# Patient Record
Sex: Female | Born: 1990 | Race: Black or African American | Hispanic: No | Marital: Single | Smoking: Current every day smoker
Health system: Southern US, Community
[De-identification: ages and names within clinical notes are randomized; demographics above are authoritative.]

## PROBLEM LIST (undated history)

## (undated) DIAGNOSIS — G43909 Migraine, unspecified, not intractable, without status migrainosus: Secondary | ICD-10-CM

## (undated) DIAGNOSIS — R55 Syncope and collapse: Secondary | ICD-10-CM

## (undated) DIAGNOSIS — G244 Idiopathic orofacial dystonia: Secondary | ICD-10-CM

## (undated) DIAGNOSIS — L930 Discoid lupus erythematosus: Secondary | ICD-10-CM

## (undated) DIAGNOSIS — F32A Depression, unspecified: Secondary | ICD-10-CM

## (undated) DIAGNOSIS — D649 Anemia, unspecified: Secondary | ICD-10-CM

## (undated) DIAGNOSIS — N920 Excessive and frequent menstruation with regular cycle: Secondary | ICD-10-CM

## (undated) DIAGNOSIS — D509 Iron deficiency anemia, unspecified: Secondary | ICD-10-CM

## (undated) DIAGNOSIS — F411 Generalized anxiety disorder: Secondary | ICD-10-CM

## (undated) HISTORY — DX: Idiopathic orofacial dystonia: G24.4

## (undated) HISTORY — DX: Excessive and frequent menstruation with regular cycle: N92.0

## (undated) HISTORY — DX: Generalized anxiety disorder: F41.1

## (undated) HISTORY — DX: Migraine, unspecified, not intractable, without status migrainosus: G43.909

## (undated) HISTORY — PX: TUBAL LIGATION: SHX77

## (undated) HISTORY — DX: Depression, unspecified: F32.A

## (undated) HISTORY — DX: Iron deficiency anemia, unspecified: D50.9

---
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
## 2010-11-19 LAB — COMPREHENSIVE METABOLIC PANEL
AST: 24 U/L (ref 0–37)
Albumin: 3.5 g/dL (ref 3.5–5.2)
Calcium: 9.5 mg/dL (ref 8.4–10.5)
Creatinine, Ser: 0.98 mg/dL (ref 0.4–1.2)
GFR calc Af Amer: >60 mL/min (ref >60)

## 2010-11-19 LAB — URINALYSIS, ROUTINE W REFLEX MICROSCOPIC
Glucose, UA: NEGATIVE mg/dL
Hgb urine dipstick: NEGATIVE
Specific Gravity, Urine: 1.016 (ref 1.005–1.030)
pH: 7 (ref 5.0–8.0)

## 2010-11-19 LAB — URINE MICROSCOPIC-ADD ON

## 2010-11-20 LAB — URINALYSIS, ROUTINE W REFLEX MICROSCOPIC
Hgb urine dipstick: NEGATIVE
Protein, ur: NEGATIVE mg/dL
Urobilinogen, UA: 0.2 mg/dL (ref 0.0–1.0)

## 2010-11-20 LAB — DIFFERENTIAL
Basophils Absolute: 0.2 10*3/uL — ABNORMAL HIGH (ref 0.0–0.1)
Basophils Relative: 4 % — ABNORMAL HIGH (ref 0–1)
Eosinophils Relative: 2 % (ref 0–5)
Monocytes Absolute: 0.2 10*3/uL (ref 0.1–1.0)
Neutro Abs: 2.1 10*3/uL (ref 1.7–7.7)

## 2010-11-20 LAB — CBC
Hemoglobin: 13.4 g/dL (ref 12.0–15.0)
MCHC: 34.7 g/dL (ref 30.0–36.0)
RDW: 14.4 % (ref 11.5–15.5)

## 2010-11-20 LAB — POCT PREGNANCY, URINE: Preg Test, Ur: NEGATIVE

## 2011-01-05 IMAGING — CT CT HEAD W/O CM
1 series · 16 of 30 positions shown, 20 images · non-contrast
Comparison: None

CLINICAL DATA: Headaches and weakness.

CT HEAD WITHOUT CONTRAST
TECHNIQUE: Contiguous axial images were obtained from the base of
the skull through the vertex without contrast.

[Series 2: head_seq 4.5 h37s st · axial · 0.43mm/px · z∈[-107,+19]mm · 16 of 32 slices shown, 20 images]
[im 2/32  brain]
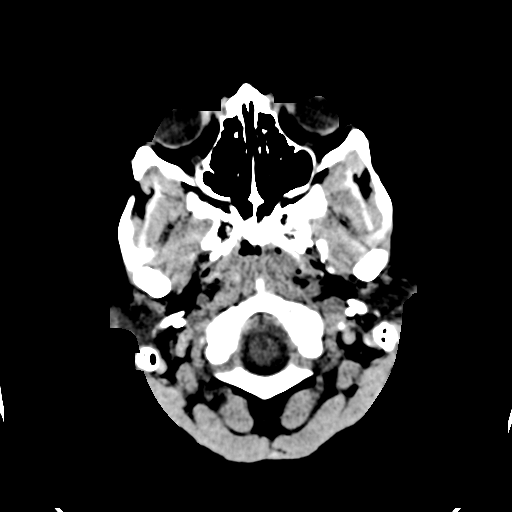
[im 2/32  bone]
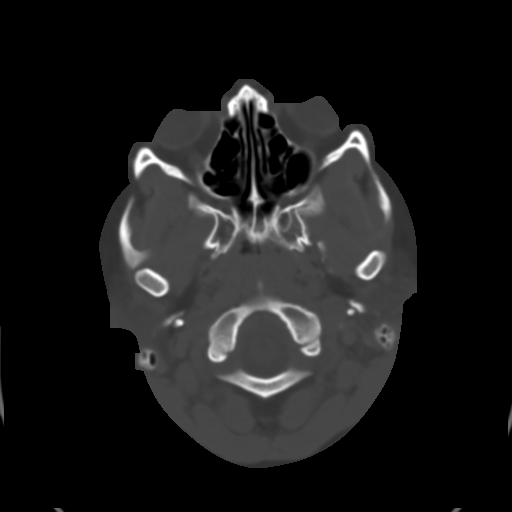
[im 4/32  brain]
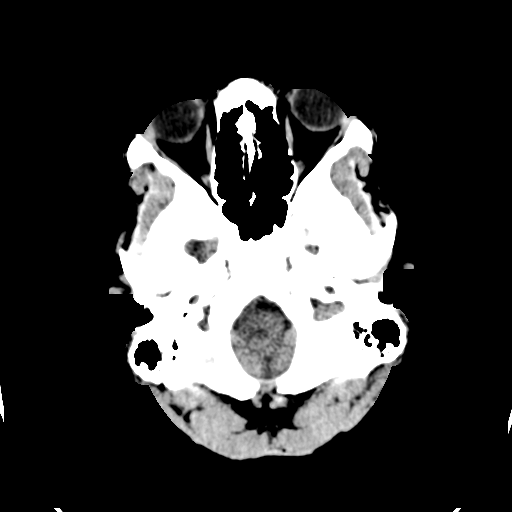
[im 6/32  brain]
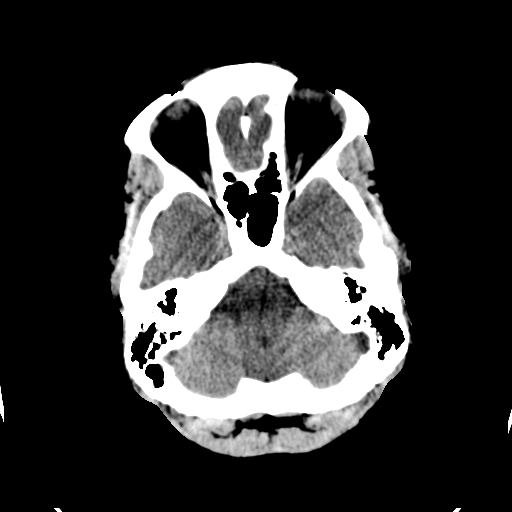
[im 8/32  brain]
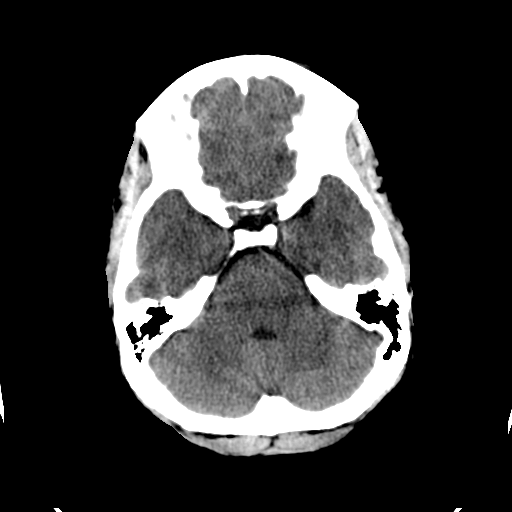
[im 9/32  brain]
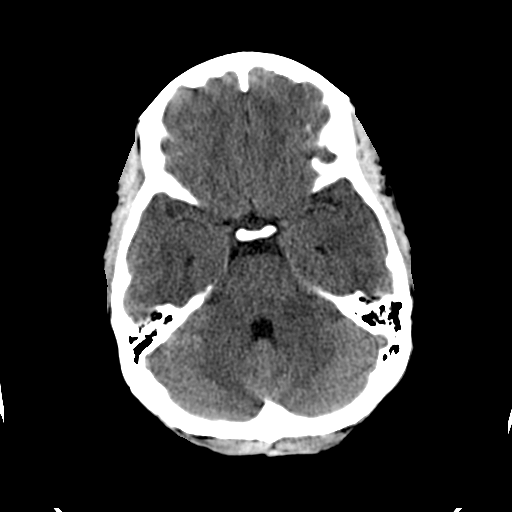
[im 9/32  bone]
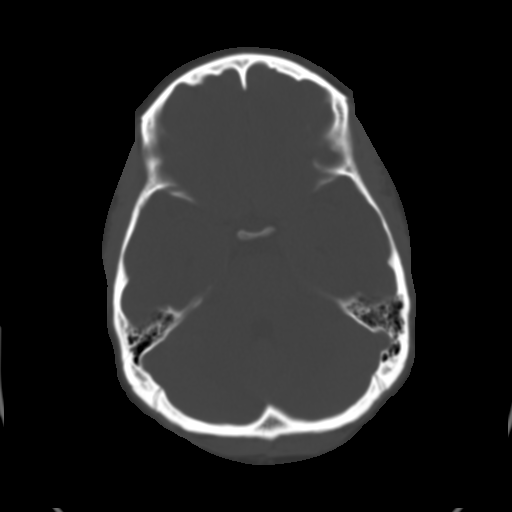
[im 11/32  brain]
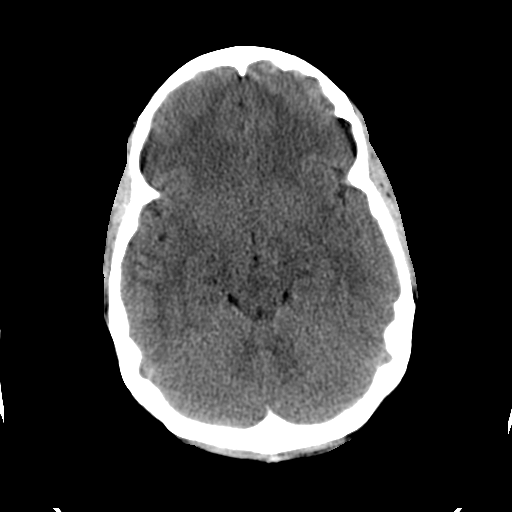
[im 13/32  brain]
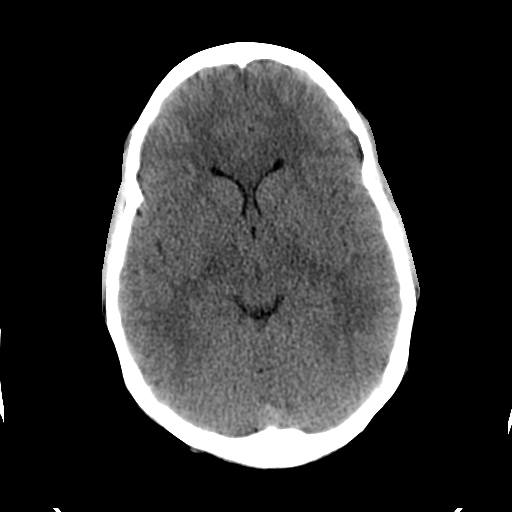
[im 15/32  brain]
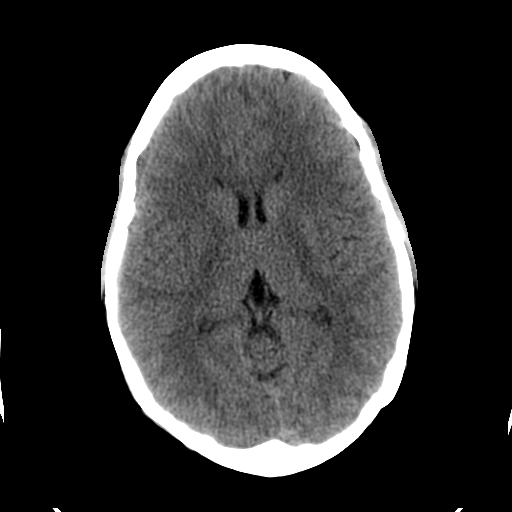
[im 17/32  brain]
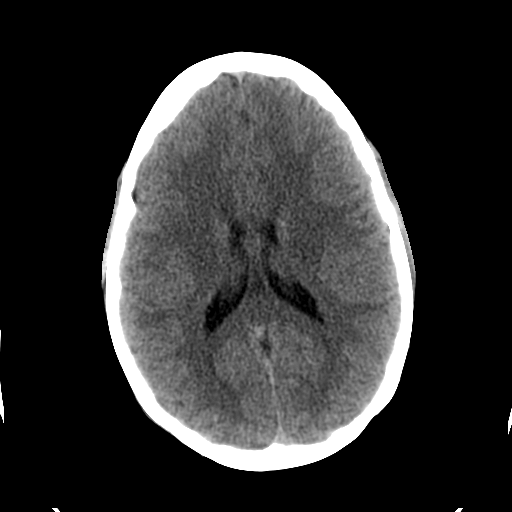
[im 17/32  bone]
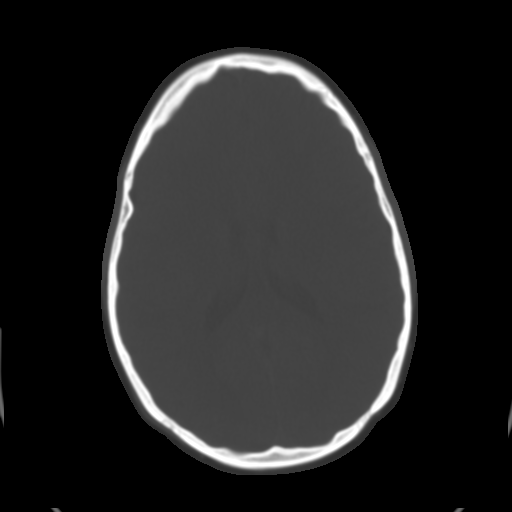
[im 19/32  brain]
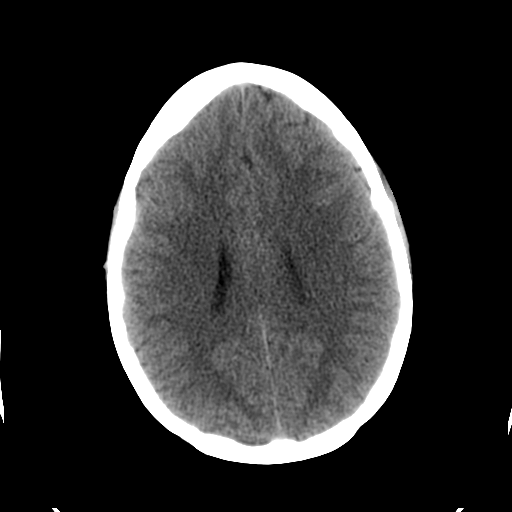
[im 21/32  brain]
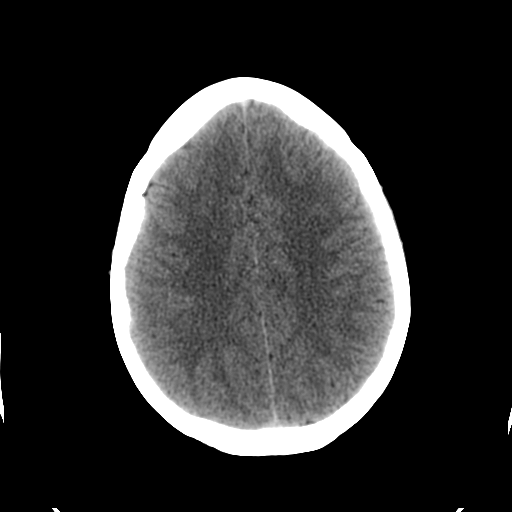
[im 23/32  brain]
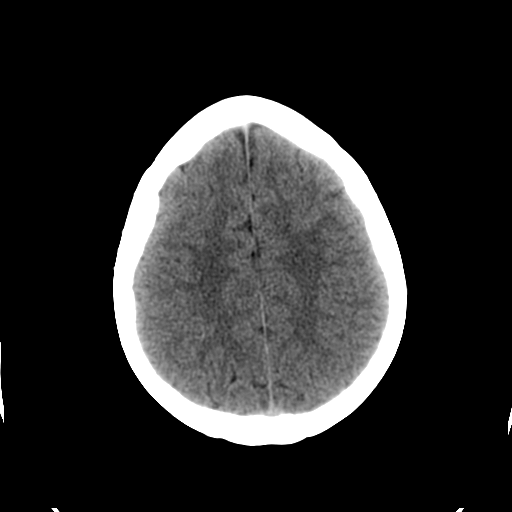
[im 24/32  brain]
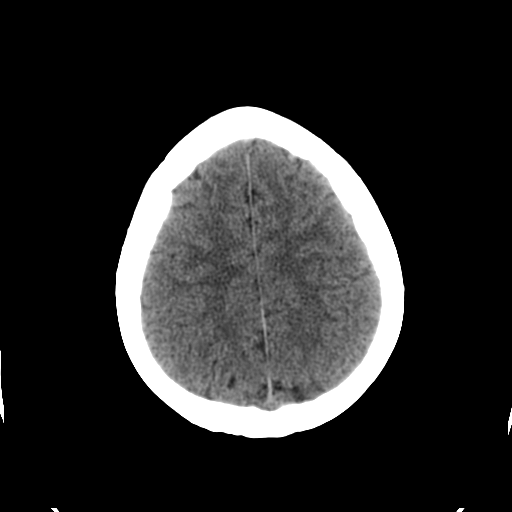
[im 24/32  bone]
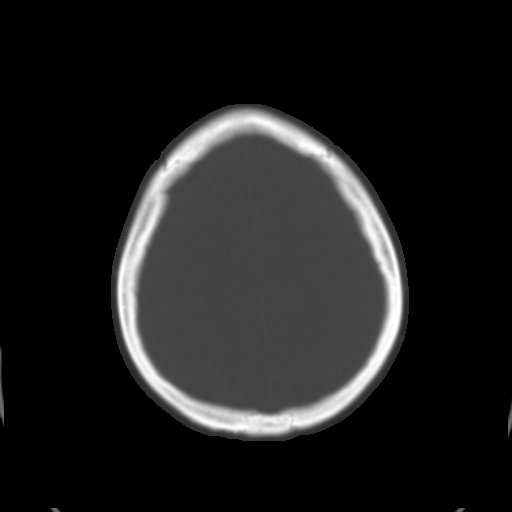
[im 26/32  brain]
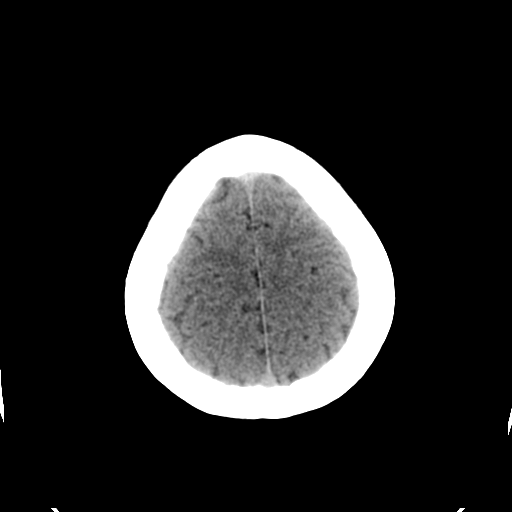
[im 28/32  brain]
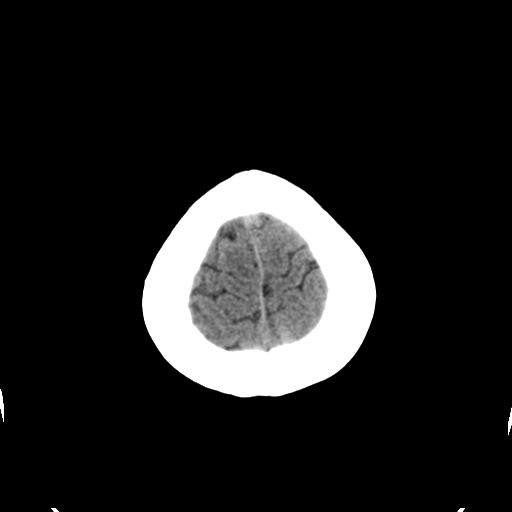
[im 30/32  brain]
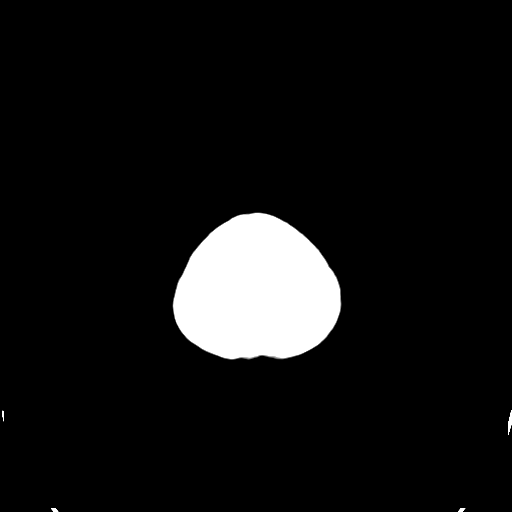

[16 of 30 positions shown; findings below may reference images not displayed]

FINDINGS: The ventricles are normal.  No extra-axial fluid
collections are seen.  The brainstem and cerebellum are
unremarkable.  No acute intracranial findings or mass lesions.

The bony calvarium is intact.  The visualized paranasal sinuses and
mastoid air cells are clear.
IMPRESSION: Negative head CT

## 2011-05-08 LAB — BASIC METABOLIC PANEL
BUN: 9
Calcium: 9.3
Creatinine, Ser: 1
Glucose, Bld: 112 — ABNORMAL HIGH
Sodium: 140

## 2011-05-08 LAB — CBC
Platelets: 211
RDW: 17.5 — ABNORMAL HIGH

## 2011-08-16 ENCOUNTER — Emergency Department (HOSPITAL_COMMUNITY)
Admission: EM | Admit: 2011-08-16 | Discharge: 2011-08-16 | Disposition: A | Discharge status: Home / Self Care | Payer: Self-pay | Attending: Emergency Medicine | Admitting: Emergency Medicine

## 2011-08-16 ENCOUNTER — Encounter: Payer: Self-pay | Admitting: *Deleted

## 2011-08-16 DIAGNOSIS — IMO0001 Reserved for inherently not codable concepts without codable children: Secondary | ICD-10-CM | POA: Insufficient documentation

## 2011-08-16 DIAGNOSIS — R05 Cough: Secondary | ICD-10-CM | POA: Insufficient documentation

## 2011-08-16 DIAGNOSIS — J45909 Unspecified asthma, uncomplicated: Secondary | ICD-10-CM | POA: Insufficient documentation

## 2011-08-16 DIAGNOSIS — J3489 Other specified disorders of nose and nasal sinuses: Secondary | ICD-10-CM | POA: Insufficient documentation

## 2011-08-16 DIAGNOSIS — H9209 Otalgia, unspecified ear: Secondary | ICD-10-CM | POA: Insufficient documentation

## 2011-08-16 DIAGNOSIS — L02219 Cutaneous abscess of trunk, unspecified: Secondary | ICD-10-CM | POA: Insufficient documentation

## 2011-08-16 DIAGNOSIS — R51 Headache: Secondary | ICD-10-CM | POA: Insufficient documentation

## 2011-08-16 DIAGNOSIS — M2550 Pain in unspecified joint: Secondary | ICD-10-CM | POA: Insufficient documentation

## 2011-08-16 DIAGNOSIS — L02214 Cutaneous abscess of groin: Secondary | ICD-10-CM

## 2011-08-16 DIAGNOSIS — R059 Cough, unspecified: Secondary | ICD-10-CM | POA: Insufficient documentation

## 2011-08-16 DIAGNOSIS — R070 Pain in throat: Secondary | ICD-10-CM | POA: Insufficient documentation

## 2011-08-16 MED ORDER — OXYCODONE-ACETAMINOPHEN 5-325 MG PO TABS
2.0000 | ORAL_TABLET | Freq: Once | ORAL | Status: AC
  Administered 2011-08-16: 2 via ORAL
  Filled 2011-08-16: qty 2

## 2011-08-16 MED ORDER — OXYCODONE-ACETAMINOPHEN 5-325 MG PO TABS: 2.0000 | ORAL_TABLET | ORAL | Status: AC | PRN

## 2011-08-16 MED ORDER — SULFAMETHOXAZOLE-TRIMETHOPRIM 800-160 MG PO TABS: 1.0000 | ORAL_TABLET | Freq: Two times a day (BID) | ORAL | Status: AC

## 2011-08-16 NOTE — ED Notes (Signed)
Pt stated understanding of discharge instructions.

## 2011-08-16 NOTE — ED Provider Notes (Signed)
  INCISION AND DRAINAGE Performed by: Rochel Brome A Consent: Verbal consent obtained. Risks and benefits: risks, benefits and alternatives were discussed Type: abscess  Body area: right groin  Anesthesia: local infiltration  Local anesthetic: lidocaine 2% with epinephrine  Anesthetic total: 3 ml  Complexity: complex Blunt dissection to break up loculations  Drainage: purulent  Drainage amount: moderate  Packing material: 1/4 in iodoform gauze  Patient tolerance: Patient tolerated the procedure well with no immediate complications.     Demetrius Charity, PA 08/16/11 1943

## 2011-08-16 NOTE — ED Provider Notes (Signed)
Medical screening examination/treatment/procedure(s) were conducted as a shared visit with non-physician practitioner(s) and myself.  I personally evaluated the patient during the encounter  PA performed I&D only.  Hurman Horn, MD 08/16/11 2250

## 2011-08-16 NOTE — ED Notes (Signed)
Patient present to ED with multiple complaints.  Patient has a headache for about a week, ear pain, throat pain, and ingrown hair on her inner thigh area.

## 2011-08-16 NOTE — ED Provider Notes (Signed)
History     CSN: [id number]  Arrival date & time 08/16/11  1437   First MD Initiated Contact with Patient 08/16/11 1825      Chief Complaint  Patient presents with  . Headache  . Otalgia  . throat pain   . ingrown hair     (Consider location/radiation/quality/duration/timing/severity/associated sxs/prior treatment) HPI [name] who states she has a one-week history of a gradual onset typical headache for her with a global headache that was gradual in onset constant not better with over-the-counter medicines but usually gets better with Percocet. She is no change in speech vision swallowing or understanding and no localized weakness numbness or incoordination. There is no sudden onset no trauma with no fever. She has some baseline generalized myalgias and arthralgias. She states she has some gradually improving slight cough nasal congestion over the last several days which is now her concern today. Her mild body aches or not her concern today either. She was like Percocet for her headache. She also has several days of a recurrent abscess in the right groin area which she has had to have incised and drained in the past. She does not have abdominal pain vomiting or diarrhea or urinary symptoms. She has severe localized pain and tenderness to a small right groin abscess. Past Medical History  Diagnosis Date  . Asthma     History reviewed. No pertinent past surgical history.  No family history on file.  History  Substance Use Topics  . Smoking status: Never Smoker   . Smokeless tobacco: Not on file  . Alcohol Use: No    OB History    Grav Para Term Preterm Abortions TAB SAB Ect Mult Living                  Review of Systems  Constitutional: Negative for fever.       10 Systems reviewed and are negative for acute change except as noted in the HPI.  HENT: Positive for congestion.   Eyes: Negative for discharge and redness.  Respiratory: Positive for cough.  Negative for shortness of breath.   Cardiovascular: Negative for chest pain.  Gastrointestinal: Negative for vomiting and abdominal pain.  Musculoskeletal: Positive for myalgias and arthralgias. Negative for back pain.  Skin: Negative for rash.  Neurological: Positive for headaches. Negative for syncope and numbness.  Psychiatric/Behavioral:       No behavior change.    Allergies  Review of patient's allergies indicates no known allergies.  Home Medications   Current Outpatient Rx  Name Route Sig Dispense Refill  . OXYCODONE-ACETAMINOPHEN 5-325 MG PO TABS Oral Take 2 tablets by mouth every 4 (four) hours as needed for pain. 20 tablet 0  . SULFAMETHOXAZOLE-TRIMETHOPRIM 800-160 MG PO TABS Oral Take 1 tablet by mouth 2 (two) times daily. One po bid x 7 days 14 tablet 0    BP 111/59  Pulse 76  Temp(Src) 98.2 F (36.8 C) (Oral)  Resp 20  SpO2 100%  Physical Exam  Nursing note and vitals reviewed. Constitutional:       Awake, alert, nontoxic appearance with baseline speech for patient.  HENT:  Head: Atraumatic.  Mouth/Throat: No oropharyngeal exudate.  Eyes: EOM are normal. Pupils are equal, round, and reactive to light. Right eye exhibits no discharge. Left eye exhibits no discharge.  Neck: Neck supple.  Cardiovascular: Normal rate and regular rhythm.   No murmur heard. Pulmonary/Chest: Effort normal and breath sounds normal. No stridor. No respiratory  distress. She has no wheezes. She has no rales. She exhibits no tenderness.  Abdominal: Soft. Bowel sounds are normal. She exhibits no mass. There is no tenderness. There is no rebound.  Genitourinary:       A chaperone was present for examination of the right inguinal region by the mons pubis revealing a localized area less than 2 cm in size of fluctuance tenderness and abscess with a bedside ultrasound revealing a fluid collection consistent with abscess without surrounding cellulitis and no subcutaneous emphysema    Musculoskeletal: She exhibits no tenderness.       Baseline ROM, moves extremities with no obvious new focal weakness.  Lymphadenopathy:    She has no cervical adenopathy.  Neurological: She is alert.       Awake, alert, cooperative and aware of situation; motor strength bilaterally; sensation normal to light touch bilaterally; peripheral visual fields full to confrontation; no facial asymmetry; tongue midline; major cranial nerves appear intact; no pronator drift, normal finger to nose bilaterally, baseline gait without new ataxia.  Skin: No rash noted.  Psychiatric: She has a normal mood and affect.    ED Course  Procedures (including critical care time)  This was a shared visit with a mid-level with the physician's assistant performing the procedure only on this patient of the incision and drainage of a localized abscess to the right lower abdominal region medial to the inguinal crease near the mons pubis Labs Reviewed - No data to display No results found.   1. Headache   2. Abscess of groin, right       MDM  I doubt any other EMC precluding discharge at this time including, but not necessarily limited to the following:sepsis, SBI, SAH, CVA.        Hurman Horn, MD 08/16/11 2329

## 2011-10-03 ENCOUNTER — Emergency Department (INDEPENDENT_AMBULATORY_CARE_PROVIDER_SITE_OTHER)
Admission: EM | Admit: 2011-10-03 | Discharge: 2011-10-03 | Disposition: A | Discharge status: Home / Self Care | Payer: Self-pay | Source: Home / Self Care | Attending: Emergency Medicine | Admitting: Emergency Medicine

## 2011-10-03 ENCOUNTER — Encounter (HOSPITAL_COMMUNITY): Payer: Self-pay | Admitting: *Deleted

## 2011-10-03 DIAGNOSIS — J111 Influenza due to unidentified influenza virus with other respiratory manifestations: Secondary | ICD-10-CM

## 2011-10-03 DIAGNOSIS — L509 Urticaria, unspecified: Secondary | ICD-10-CM

## 2011-10-03 HISTORY — DX: Discoid lupus erythematosus: L93.0

## 2011-10-03 HISTORY — DX: Syncope and collapse: R55

## 2011-10-03 MED ORDER — ONDANSETRON 8 MG PO TBDP: 8.0000 mg | ORAL_TABLET | Freq: Three times a day (TID) | ORAL | Status: AC | PRN

## 2011-10-03 MED ORDER — HYDROXYZINE HCL 25 MG PO TABS
25.0000 mg | ORAL_TABLET | Freq: Four times a day (QID) | ORAL | Status: AC
Start: 2011-10-03 — End: 2011-10-13

## 2011-10-03 NOTE — Discharge Instructions (Signed)

## 2011-10-03 NOTE — ED Notes (Signed)
C/O fever, HA, fatigue since yesterday.  Last night started w/ pruritic, red, rash to BUE and BLE.  Has been taking Benadryl.  Denies any cough, fevers, n/v/d.

## 2011-10-03 NOTE — ED Provider Notes (Signed)
Chief Complaint  Patient presents with  . Fever  . Headache  . Rash    History of Present Illness:   Shannon Salazar is a 21 year old mother of twins had a two-day history of temperature of up to 101.1, chills, sweats, headaches, slight sore throat, and nausea. She has a history of discoid lupus. Hives on her right arm and right leg. She denies any nasal congestion, rhinorrhea, coughing, vomiting, diarrhea, or abdominal pain. All of her family members have. Combinations of viral symptoms including coughing, nausea, vomiting, nasal congestion. She has gotten the influenza vaccine this year.  Review of Systems:  Other than noted above, the patient denies any of the following symptoms. Systemic:  No fever, chills, sweats, fatigue, myalgias, headache, or anorexia. Eye:  No redness, pain or drainage. ENT:  No earache, nasal congestion, rhinorrhea, sinus pressure, or sore throat. Lungs:  No cough, sputum production, wheezing, shortness of breath. Or chest pain. GI:  No nausea, vomiting, abdominal pain or diarrhea. Skin:  No rash or itching.  PMFSH:  Past medical history, family history, social history, meds, and allergies were reviewed.  Physical Exam:   Vital signs:  BP 104/61  Pulse 114  Temp(Src) 99.3 F (37.4 C) (Oral)  Resp 16  SpO2 100%  LMP 09/23/2011 General:  Alert, in no distress. Eye:  No conjunctival injection or drainage. ENT:  TMs and canals were normal, without erythema or inflammation.  Nasal mucosa was clear and uncongested, without drainage.  Mucous membranes were moist.  Pharynx was erythematous but there was no exudate or drainage.  There were no oral ulcerations or lesions. Neck:  Supple, no adenopathy, tenderness or mass. Lungs:  No respiratory distress.  Lungs were clear to auscultation, without wheezes, rales or rhonchi.  Breath sounds were clear and equal bilaterally. Heart:  Regular rhythm, without gallops, murmers or rubs. Skin:  Clear, warm, and dry, she has a raised  erythematous rash on her right forearm, skin was otherwise clear.   Assessment:   Diagnoses that have been ruled out:  None  Diagnoses that are still under consideration:  None  Final diagnoses:  Influenza-like illness  Urticaria      Plan:   1.  The following meds were prescribed:   New Prescriptions   HYDROXYZINE (ATARAX/VISTARIL) 25 MG TABLET    Take 1 tablet (25 mg total) by mouth every 6 (six) hours.   ONDANSETRON (ZOFRAN ODT) 8 MG DISINTEGRATING TABLET    Take 1 tablet (8 mg total) by mouth every 8 (eight) hours as needed for nausea.   2.  The patient was instructed in symptomatic care and handouts were given. 3.  The patient was told to return if becoming worse in any way, if no better in 3 or 4 days, and given some red flag symptoms that would indicate earlier return.   Roque Lias, MD 10/03/11 2107

## 2012-08-07 ENCOUNTER — Encounter (HOSPITAL_COMMUNITY): Payer: Self-pay | Admitting: *Deleted

## 2012-08-07 ENCOUNTER — Inpatient Hospital Stay (HOSPITAL_COMMUNITY)
Admission: AD | Admit: 2012-08-07 | Discharge: 2012-08-07 | Disposition: A | Discharge status: Home / Self Care | Payer: Medicaid Other | Source: Ambulatory Visit | Attending: Obstetrics & Gynecology | Admitting: Obstetrics & Gynecology

## 2012-08-07 DIAGNOSIS — O479 False labor, unspecified: Secondary | ICD-10-CM

## 2012-08-07 DIAGNOSIS — O4700 False labor before 37 completed weeks of gestation, unspecified trimester: Secondary | ICD-10-CM | POA: Insufficient documentation

## 2012-08-07 DIAGNOSIS — M549 Dorsalgia, unspecified: Secondary | ICD-10-CM

## 2012-08-07 LAB — WET PREP, GENITAL
Clue Cells Wet Prep HPF POC: NONE SEEN
Yeast Wet Prep HPF POC: NONE SEEN

## 2012-08-07 LAB — URINALYSIS, ROUTINE W REFLEX MICROSCOPIC
Nitrite: NEGATIVE
Specific Gravity, Urine: 1.01 (ref 1.005–1.030)
Urobilinogen, UA: 1 mg/dL (ref 0.0–1.0)

## 2012-08-07 LAB — URINE MICROSCOPIC-ADD ON

## 2012-08-07 MED ORDER — ACETAMINOPHEN 325 MG PO TABS
650.0000 mg | ORAL_TABLET | Freq: Once | ORAL | Status: AC
  Administered 2012-08-07: 650 mg via ORAL
  Filled 2012-08-07: qty 2

## 2012-08-07 MED ORDER — ACETAMINOPHEN 325 MG PO TABS: 650.0000 mg | ORAL_TABLET | Freq: Four times a day (QID) | ORAL | Status: AC | PRN

## 2012-08-07 NOTE — MAU Provider Note (Signed)
History     CSN: [id number]  Arrival date and time: 08/07/12 4098   None     Chief Complaint  Patient presents with  . Contractions  . Rupture of Membranes   HPI: Pt is a 21 y.o. G2P0102 at 101w2d who presents for possible PPROM. Pt states she has had light trickle of fluid "for about a week;" pt believes it started 12/17 or 12/18 but states she constantly "stays wet" (?leaks urine) and has a discharge "even before pregnancy," and has had some similar discharge earlier in pregnancy and assumed that the current leakage was the same. States she also had some contractions earlier last night, starting around 9 PM; contractions were initially "back to back for the first two, then every several minutes," but now are more like one contraction every hour or so. Mostly now complains of back pain, which she thought initially was going into labor, with the contractions. Denies bleeding. Good fetal movements.  Otherwise has few complaints. Denies headache/change in vision, fever/chills. No RUQ pain. Some nausea, intermittently.  [facility]renatal care in [facility], Dr. Elisabeth Most. She is visiting for the holidays. Pt states pregnancy has been uncomplicated. Prior pregnancy was twins, delivered at 34 weeks by c-section due to preterm labor and breech presentation (pt had preterm labor at 32 weeks, was admitted for steroid shots and bed rest, then went home for 4 days and was readmitted for section).  OB History    Grav Para Term Preterm Abortions TAB SAB Ect Mult Living   2 1  1     1 2       Past Medical History  Diagnosis Date  . Asthma   . Discoid lupus   . Syncope     Past Surgical History  Procedure Date  . Cesarean section     twin delivery    History reviewed. No pertinent family history.  History  Substance Use Topics  . Smoking status: Never Smoker   . Smokeless tobacco: Not on file  . Alcohol Use: No    Allergies: No Known Allergies  Prescriptions prior to admission    Medication Sig Dispense Refill  . Multiple Vitamin (MULTIVITAMIN) tablet Take 1 tablet by mouth daily.        ROS: See HPI  Physical Exam   Blood pressure 97/59, pulse 102, temperature 98 F (36.7 C), temperature source Oral, resp. rate 20, height 5\' 4"  (1.626 m), weight 98.703 kg (217 lb 9.6 oz), last menstrual period 02/12/2012, unknown if currently breastfeeding.  Physical Exam  Vitals reviewed. Constitutional: She is oriented to person, place, and time. She appears well-developed. No distress.  HENT:  Head: Normocephalic and atraumatic.  Eyes: Conjunctivae normal are normal. Pupils are equal, round, and reactive to light.  Neck: Normal range of motion. Neck supple.  Cardiovascular: Normal rate and regular rhythm.   No murmur heard. Respiratory: Effort normal and breath sounds normal. She has no wheezes.  GI: Soft. Bowel sounds are normal. There is no tenderness.  Genitourinary: Vagina normal. Pelvic exam was performed with patient prone.       Sterile speculum exam shows visibly closed cervix, no frank pooling, no cervical bleeding. Moderate amount of white mucous-y discharge.  Musculoskeletal: Normal range of motion. She exhibits no edema.  Neurological: She is alert and oriented to person, place, and time.  Skin: Skin is warm and dry. No erythema.  Psychiatric: She has a normal mood and affect. Her behavior is normal.    MAU Course  Procedures -  sterile speculum exam -Amnisure -wet prep, GC/Chlamydia probe  MDM -FHR tracing reassuring; baseline 150, mod variability, present accels -sterile spec exam shows cervix visually closed with moderate amount mucous-y white discharge without gross pooling  Assessment and Plan  21 y.o. G2P0102 at [redacted]w[redacted]d -SIUP, initial concern for possible preterm premature rupture of membranes --> Amnisure NEGATIVE  -wet prep negative; GC/Chlamydia pending, pt instructed to call early next week for results  -considered [place] but FHR tracing  reassuring and cervix visualized closed  -Tylenol given x1 for back pain, prior to discharge  -pt instructed to call her regular OB on Monday to see if she needs to follow up with them sooner than her scheduled appt on 08/17/2012  -preterm labor precautions reviewed and warning signs discussed for when to return to MAU/ED  Above discussed with Philipp Deputy, CNM.  Street, Christopher 08/07/2012, 7:27 AM   II agree with the above. Cam Hai 7:22 PM 08/08/2012

## 2012-08-07 NOTE — MAU Note (Signed)
PT SAYS  SHE HAS BEEN LEAKING SOMETHING WET. SINCE 07-28-2012-   SHE LIVES IN [facility].  HERE TO  VISIT.    SAYS  THOUGHT IT WAS URINE-   SOMETIMES IT RUNS DOWN HER LEGS.   IS GETTING [facility] IN [place]-   DR Elisabeth Most .  NO PROBLEMS WITH PREG..    TONIGHT AT 9 PM SHE FELT UC'S-   HER BACK STILL HURTS- AND SHE STILL FEELS WET.

## 2012-08-07 NOTE — MAU Note (Signed)
Have been moist all wk with clear, watery fld. Thought it was normal cause I have some d/c with pregnancy. But I started contracting tonight.

## 2012-08-08 LAB — URINE CULTURE

## 2012-08-10 NOTE — MAU Provider Note (Signed)
Attestation of Attending Supervision of Advanced Practitioner (CNM/NP): Evaluation and management procedures were performed by the Advanced Practitioner under my supervision and collaboration. I have reviewed the Advanced Practitioner's note and chart, and I agree with the management and plan.  Vale Peraza H. 7:54 PM   

## 2014-06-12 ENCOUNTER — Encounter (HOSPITAL_COMMUNITY): Payer: Self-pay | Admitting: *Deleted

## 2015-03-29 ENCOUNTER — Other Ambulatory Visit (HOSPITAL_COMMUNITY): Payer: Self-pay | Admitting: Obstetrics and Gynecology

## 2015-03-29 DIAGNOSIS — Z3682 Encounter for antenatal screening for nuchal translucency: Secondary | ICD-10-CM

## 2015-03-29 DIAGNOSIS — Z3A12 12 weeks gestation of pregnancy: Secondary | ICD-10-CM

## 2015-04-03 ENCOUNTER — Encounter (HOSPITAL_COMMUNITY): Payer: Self-pay | Admitting: Obstetrics and Gynecology

## 2015-04-13 ENCOUNTER — Encounter (HOSPITAL_COMMUNITY): Payer: Medicaid Other

## 2015-04-13 ENCOUNTER — Ambulatory Visit (HOSPITAL_COMMUNITY): Payer: Medicaid Other

## 2015-04-13 ENCOUNTER — Other Ambulatory Visit (HOSPITAL_COMMUNITY): Payer: Self-pay | Admitting: Obstetrics and Gynecology

## 2015-05-22 ENCOUNTER — Other Ambulatory Visit (HOSPITAL_COMMUNITY): Payer: Self-pay | Admitting: Obstetrics and Gynecology

## 2015-05-22 ENCOUNTER — Ambulatory Visit (HOSPITAL_COMMUNITY)
Admission: RE | Admit: 2015-05-22 | Discharge: 2015-05-22 | Disposition: A | Discharge status: Home / Self Care | Payer: Medicaid Other | Source: Ambulatory Visit | Attending: Obstetrics and Gynecology | Admitting: Obstetrics and Gynecology

## 2015-05-22 ENCOUNTER — Encounter (HOSPITAL_COMMUNITY): Payer: Self-pay

## 2015-05-22 DIAGNOSIS — Z3689 Encounter for other specified antenatal screening: Secondary | ICD-10-CM

## 2015-05-22 DIAGNOSIS — Z3A18 18 weeks gestation of pregnancy: Secondary | ICD-10-CM

## 2015-05-22 DIAGNOSIS — M329 Systemic lupus erythematosus, unspecified: Secondary | ICD-10-CM

## 2015-05-22 DIAGNOSIS — M351 Other overlap syndromes: Secondary | ICD-10-CM

## 2015-05-22 DIAGNOSIS — O34219 Maternal care for unspecified type scar from previous cesarean delivery: Secondary | ICD-10-CM | POA: Insufficient documentation

## 2015-05-22 DIAGNOSIS — Z8751 Personal history of pre-term labor: Secondary | ICD-10-CM

## 2015-05-22 DIAGNOSIS — Z36 Encounter for antenatal screening of mother: Secondary | ICD-10-CM | POA: Diagnosis not present

## 2015-05-22 DIAGNOSIS — O26892 Other specified pregnancy related conditions, second trimester: Secondary | ICD-10-CM

## 2015-05-22 DIAGNOSIS — Z3A12 12 weeks gestation of pregnancy: Secondary | ICD-10-CM

## 2015-05-22 DIAGNOSIS — Z3682 Encounter for antenatal screening for nuchal translucency: Secondary | ICD-10-CM

## 2015-05-22 DIAGNOSIS — M359 Systemic involvement of connective tissue, unspecified: Secondary | ICD-10-CM

## 2015-05-22 HISTORY — DX: Anemia, unspecified: D64.9

## 2015-05-22 NOTE — Progress Notes (Signed)
MFM Staff Consultation Note:  Impressions with accompanying discussion:  1. 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 It is not clear what her condition is noting that she has 2 of 17 criteria required to diagnose SLE (ie, 4 of 17 are required for formal diagnosis by "Classification Criteria for systemic lupus erythematosis" published by the [employer] of [employer]umatology and Systemic Lupus [employer] Collaborating Clinics.  She has received intermittent courses of prednisone for flares that she states as  Nonetheless, I indicated autoimmune conditions in reproductive aged African American women is a relatively uncommon but more prevalent in women of her ethnicity and age group with a prevalence of 1%. There is no clear evidence the undifferentiated autoimmune disease increases risk of spontaneous abortions or preterm labor and thus chose to address that issue separately (see #2 below).  Given suspected systemic autoimmune condition (given joint and skin involvement), I additionally explained the increased risks of abnormal fetal growth and IUGR, oligohydramnios, and stillbirth related to placental insufficiency. I also explained the increased risk of pre-eclampsia, and I described the findings of elevated blood pressure, new-onset (or worsened) proteinuria, and edema that characterize the disease.  I told your patient that her blood pressure would be carefully followed, and if she becomes hypertensive, an evaluation of her 24-hour urinary total protein will be in order, along with the usual "pre-eclamptic labs."  That being said, I  would recommend that a baseline 24 hour urine and preecalmpsia labs along with an SSA and SSB antibody titer to ensure that these are screened for in this patient (ie, call MFM if positive).  We spoke about the medications commonly used to treat connective tissue/autoimmune diseases. I explained that prednisone is generally considered to be safe in pregnancy, with most of its side effects being maternal, including fluid retention and adrenal suppression. Little, if any, prednisone crosses the placenta, and we would not expect neonatal adrenal suppression. On the other hand, women who use systemic steroids during pregnancy require stress dose steroids for delivery and the early postpartum period.  That being said, she needs formal referral by your office for Rheumatology consult to aid in arriving at a formal diagnosis and for comanagement in the pregnancy If steroids are used for treatment by rheumatology, the likelihood of gestational diabetes is substantially increased. A glucola screen should be done at around 20 weeks if on prednisone by rheumatology, and repeated about 28 weeks if initially normal.  Repeat 24-hour urine collections for total protein and creatinine clearance should be performed at regular intervals depending on disease activity.  We explained among women with autoimmune disease, about 75% of pregnancies are accompanied by amelioration of signs and symptoms with the most improvement noted in the second or third trimester. Then the disease symptoms return postpartum, most often within 3 months of delivery. We also discussed risk of adrenal suppression due to chronic steroid use and recommend stress dose steroid in labor and for 24 hours postpartum due to the increased stress on her system during the labor process.  2. Hx PTB: Owing to her history of preterm delivery at 34 weeks (spontaneous labor), I recommend weekly progesterone IM (Makena) to be started prior to 20 weeks and  to be  continued through 36 weeks.  I would also recommend and have arranged follow up cervical length at 22-23 6/7 weeks given her history.    Summary of Recommendations:  1. Referral to rheumatology 2. Early glucola and repeat at 24-28 weeks if initially negative; 3. SSA/SSB antibody titers 4. Baseline 24 hour urine protein collection; 5. Interval growth ultrasounds every 4 weeks; 6. Antenatal testing consisting of twice weekly NST's at 32 weeks if patient is formally diagnosed with connective tissue disease/SLE or has need for medical treatment (eg, Plaquinil or Prednisone) 7. cervical length screening at 22 weeks (with next interval growth) owing to late preterm delivery history  8. Recommend starting 17P IM qwk (Makena) from 16-36 weeks (start by 20 weeks).   9. Delivery at around 39 weeks;  I spent in excess of 60 minutes in review of medical records, evaluation, and education of your patient in consultation. More than 50% of this time was spent in direct face-to-face counseling. It was a pleasure seeing your patient today in consultation. If desired, we would be happy to assume the prenatal care of your patient, noting that we would ask that you notify us that this is the case. Thank you for allowing [place] the opportunity to contribute to the care of your patient.  Thank you,  Shannon Salazar, Shannon Sjogren, MD, MS, FACOG Assistant Professor Section of Maternal-Fetal Medicine [facility]

## 2015-05-22 NOTE — Progress Notes (Signed)
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 It is not clear what her condition is noting that she has 2 of 17 criteria required to diagnose SLE (ie, 4 of 17 are required for formal diagnosis by "Classification Criteria for systemic lupus erythematosis" published by the [employer] [facility] and [facility]ic [facility]upus [employer] Collaborating [facility]. She has received intermittent courses of prednisone for flares that she states as  Nonetheless, I indicated autoimmune conditions in reproductive aged African American women is a relatively uncommon but more prevalent in women of her ethnicity and age group with a prevalence of 1%. There is no clear evidence the undifferentiated autoimmune disease increases risk of spontaneous abortions or preterm labor and thus chose to address that issue separately (see #2 below).  Given suspected systemic autoimmune condition (given joint and skin involvement), I additionally explained the increased risks of abnormal fetal growth and IUGR, oligohydramnios, and stillbirth related to placental insufficiency. I also explained the increased risk of  pre-eclampsia, and I described the findings of elevated blood pressure, new-onset (or worsened) proteinuria, and edema that characterize the disease.  I told your patient that her blood pressure would be carefully followed, and if she becomes hypertensive, an evaluation of her 24-hour urinary total protein will be in order, along with the usual "pre-eclamptic labs." That being said, I would recommend that a baseline 24 hour urine and preecalmpsia labs along with an SSA and SSB antibody titer to ensure that these are screened for in this patient (ie, call MFM if positive).  We spoke about the medications commonly used to treat connective tissue/autoimmune diseases. I explained that prednisone is generally considered to be safe in pregnancy, with most of its side effects being maternal, including fluid retention and adrenal suppression. Little, if any, prednisone crosses the placenta, and we would not expect neonatal adrenal suppression. On the other hand, women who use systemic steroids during pregnancy require stress dose steroids for delivery and the early postpartum period. That being said, she needs formal referral by your office for Rheumatology consult to aid in arriving at a formal diagnosis and for comanagement in the pregnancy If steroids are used for treatment by rheumatology, the likelihood of gestational diabetes is substantially increased. A glucola screen should be done at around 20 weeks if on prednisone by rheumatology, and repeated about 28 weeks if initially normal.  Repeat 24-hour urine collections for total protein and creatinine clearance should be performed at regular intervals depending on disease activity. We explained among women with autoimmune disease, about 75% of pregnancies are accompanied by amelioration of signs and symptoms with the most improvement noted in the second or third trimester. Then the disease symptoms return postpartum, most often within 3 months  of delivery.  We also discussed risk of adrenal suppression due to chronic steroid use and recommend stress dose steroid in labor and for 24 hours postpartum due to the increased stress on her system during the labor process.  2. Hx PTB: Owing to her history of preterm delivery at 34 weeks (spontaneous labor), I recommend weekly progesterone IM (Makena) to be started prior to 20 weeks and to be continued through 36 weeks. I would also recommend and have arranged follow up cervical length at 22-23 6/7 weeks given her history.   Summary of Recommendations:  1. Referral to rheumatology 2. Early glucola and repeat at 24-28 weeks if initially negative; 3. SSA/SSB antibody titers 4. Baseline 24 hour urine protein collection; 5. Interval growth ultrasounds every 4 weeks; 6. Antenatal testing consisting of twice weekly NST's at 32 weeks if patient is formally diagnosed with connective tissue disease/SLE or has need for medical treatment (eg, Plaquinil or Prednisone) 7. cervical length screening at 22 weeks (with next interval growth) owing to late preterm delivery history  8. Recommend starting 17P IM qwk (Makena) from 16-36 weeks (start by 20 weeks).  9. Delivery at around 39 weeks;  I spent in excess of 60 minutes in review of medical records, evaluation, and education of your patient in consultation. More than 50% of this time was spent in direct face-to-face counseling. It was a pleasure seeing your patient today in consultation. If desired, we would be happy to assume the prenatal care of your patient, noting that we would ask that you notify us that this is the case. Thank you for allowing [place] the opportunity to contribute to the care of your patient.  Thank you,  Louann Sjogren Gaynelle Arabian, Louann Sjogren, MD, MS, FACOG Assistant Professor Section of Maternal-Fetal Medicine [facility]

## 2015-05-24 ENCOUNTER — Encounter (HOSPITAL_COMMUNITY): Payer: Self-pay | Admitting: Obstetrics and Gynecology

## 2015-05-24 ENCOUNTER — Other Ambulatory Visit (HOSPITAL_COMMUNITY): Payer: Self-pay | Admitting: Obstetrics and Gynecology

## 2015-06-19 ENCOUNTER — Ambulatory Visit (HOSPITAL_COMMUNITY): Payer: Medicaid Other | Attending: Obstetrics and Gynecology

## 2015-08-25 ENCOUNTER — Emergency Department (HOSPITAL_COMMUNITY)
Admission: EM | Admit: 2015-08-25 | Discharge: 2015-08-25 | Disposition: A | Discharge status: Home / Self Care | Payer: Medicaid Other | Attending: Emergency Medicine | Admitting: Emergency Medicine

## 2015-08-25 ENCOUNTER — Encounter (HOSPITAL_COMMUNITY): Payer: Self-pay | Admitting: Emergency Medicine

## 2015-08-25 DIAGNOSIS — Z3A32 32 weeks gestation of pregnancy: Secondary | ICD-10-CM | POA: Diagnosis not present

## 2015-08-25 DIAGNOSIS — O4700 False labor before 37 completed weeks of gestation, unspecified trimester: Secondary | ICD-10-CM

## 2015-08-25 DIAGNOSIS — Z79899 Other long term (current) drug therapy: Secondary | ICD-10-CM | POA: Insufficient documentation

## 2015-08-25 DIAGNOSIS — J45909 Unspecified asthma, uncomplicated: Secondary | ICD-10-CM | POA: Diagnosis not present

## 2015-08-25 DIAGNOSIS — O4703 False labor before 37 completed weeks of gestation, third trimester: Secondary | ICD-10-CM | POA: Diagnosis present

## 2015-08-25 DIAGNOSIS — O479 False labor, unspecified: Secondary | ICD-10-CM

## 2015-08-25 DIAGNOSIS — D649 Anemia, unspecified: Secondary | ICD-10-CM | POA: Diagnosis not present

## 2015-08-25 DIAGNOSIS — Z872 Personal history of diseases of the skin and subcutaneous tissue: Secondary | ICD-10-CM | POA: Insufficient documentation

## 2015-08-25 DIAGNOSIS — O99513 Diseases of the respiratory system complicating pregnancy, third trimester: Secondary | ICD-10-CM | POA: Insufficient documentation

## 2015-08-25 DIAGNOSIS — O99013 Anemia complicating pregnancy, third trimester: Secondary | ICD-10-CM | POA: Insufficient documentation

## 2015-08-25 LAB — URINALYSIS, ROUTINE W REFLEX MICROSCOPIC
BILIRUBIN URINE: NEGATIVE
Glucose, UA: NEGATIVE mg/dL
HGB URINE DIPSTICK: NEGATIVE
KETONES UR: 15 mg/dL — AB
NITRITE: NEGATIVE
PH: 5.5 (ref 5.0–8.0)
Protein, ur: NEGATIVE mg/dL
SPECIFIC GRAVITY, URINE: 1.029 (ref 1.005–1.030)

## 2015-08-25 LAB — URINE MICROSCOPIC-ADD ON

## 2015-08-25 MED ORDER — SODIUM CHLORIDE 0.9 % IV BOLUS (SEPSIS)
1000.0000 mL | Freq: Once | INTRAVENOUS | Status: AC
  Administered 2015-08-25: 1000 mL via INTRAVENOUS

## 2015-08-25 MED ORDER — TERBUTALINE SULFATE 1 MG/ML IJ SOLN
0.2500 mg | Freq: Once | INTRAMUSCULAR | Status: AC
  Administered 2015-08-25: 0.25 mg via SUBCUTANEOUS

## 2015-08-25 MED FILL — Terbutaline Sulfate Inj 1 MG/ML: INTRAMUSCULAR | Qty: 1 | Status: AC

## 2015-08-25 NOTE — Progress Notes (Signed)
Fetal heart tones 135/multiple accelerations/decelerations (Cat 1 tracing), contractions 3-7 minutes, mild. Notified Dr. Despina HiddenEure of this. Received order for Terbutaline, 0.25mg , sq and continue to monitor for 1 hour after dose. Terbutaline given @ 0640.

## 2015-08-25 NOTE — Progress Notes (Signed)
Spoke with Dr. Despina HiddenEure. Pt is no longer contracting. FHR tracing Is a category 1. Pt is okay to be d/c'd home.

## 2015-08-25 NOTE — Progress Notes (Signed)
Received from Alcide GoodnessKathy Larrabee RN. Will watch pt until 0745, 1 hour after the dose of terbutaline. If she is not contracting, she can be d/c'd home. Will call OB attending at 0745.

## 2015-08-25 NOTE — ED Notes (Signed)
OB rapid respond RN at the bedside.

## 2015-08-25 NOTE — ED Notes (Signed)
Pt. Reports that she is feeling much better.  She stated, "I do not have any contractions."   Spoke with pt; to inform her that that if her contractions start she will need to go to [facility] or come back to us immediately.

## 2015-08-25 NOTE — ED Notes (Signed)
She was visiting with a patient in the ER and started experiencing stomach contractions for 30 minutes, reports that she is [redacted] weeks pregnant.  No current complications with pregnancy. 3 children, all delivered early at 36 weeks.

## 2015-08-25 NOTE — Progress Notes (Signed)
Spoke with Dr. Elesa MassedWard. Dr. Despina HiddenEure has cleared pt to be d/c home.

## 2015-08-25 NOTE — ED Provider Notes (Addendum)
TIME SEEN: 5:45 AM  CHIEF COMPLAINT: Premature contractions  HPI: Pt is a 25 y.o. female with history of lupus, asthma who is [redacted] weeks pregnant and presents to the emergency department feeling that she is having contractions. She was here with her boyfriend who is being admitted and felt she has been having contractions for the past hour approximately 5 minutes apart. She is a G3 P3. She states she has had twins by C-section at 32 weeks and then had a another singleton pregnancy delivered by C-section at 36 weeks. She denies that she has had vaginal bleeding, leaking fluid. She has been feeling her baby move. No vomiting or diarrhea. States that she was seen at another hospital earlier this week for similar symptoms and was told she had large ketones in her urine. States she's been trying to drink more water.  ROS: See HPI Constitutional: no fever  Eyes: no drainage  ENT: no runny nose   Cardiovascular:  no chest pain  Resp: no SOB  GI: no vomiting GU: no dysuria Integumentary: no rash  Allergy: no hives  Musculoskeletal: no leg swelling  Neurological: no slurred speech ROS otherwise negative  PAST MEDICAL HISTORY/PAST SURGICAL HISTORY:  Past Medical History  Diagnosis Date  . Asthma   . Discoid lupus   . Syncope   . Anemia     MEDICATIONS:  Prior to Admission medications   Medication Sig Start Date End Date Taking? Authorizing Provider  acetaminophen (TYLENOL) 325 MG tablet Take 2 tablets (650 mg total) by mouth every 6 (six) hours as needed for pain. 08/07/12   Stephanie Couphristopher M Street, MD  Prenatal Vit-Fe Fumarate-FA (PRENATAL MULTIVITAMIN) TABS Take 1 tablet by mouth daily.    Historical Provider, MD    ALLERGIES:  No Known Allergies  SOCIAL HISTORY:  Social History  Substance Use Topics  . Smoking status: Never Smoker   . Smokeless tobacco: Not on file  . Alcohol Use: No    FAMILY HISTORY: History reviewed. No pertinent family history.  EXAM: BP 136/77 mmHg  Pulse  100  Temp(Src) 98 F (36.7 C)  Resp 20  Ht 5\' 4"  (1.626 m)  Wt 221 lb (100.245 kg)  BMI 37.92 kg/m2  SpO2 100%  LMP 01/13/2015 CONSTITUTIONAL: Alert and oriented and responds appropriately to questions. Well-appearing; well-nourished HEAD: Normocephalic EYES: Conjunctivae clear, PERRL ENT: normal nose; no rhinorrhea; moist mucous membranes; pharynx without lesions noted NECK: Supple, no meningismus, no LAD  CARD: RRR; S1 and S2 appreciated; no murmurs, no clicks, no rubs, no gallops RESP: Normal chest excursion without splinting or tachypnea; breath sounds clear and equal bilaterally; no wheezes, no rhonchi, no rales, no hypoxia or respiratory distress, speaking full sentences ABD/GI: Normal bowel sounds; non-distended; soft, non-tender, no rebound, no guarding, no peritoneal signs GU:  Patient has a small amount of thin white vaginal discharge without odor on exam, normal external genitalia, no vaginal bleeding, cervix is 1 cm, 50% effaced BACK:  The back appears normal and is non-tender to palpation, there is no CVA tenderness EXT: Normal ROM in all joints; non-tender to palpation; no edema; normal capillary refill; no cyanosis, no calf tenderness or swelling    SKIN: Normal color for age and race; warm NEURO: Moves all extremities equally, sensation to light touch intact diffusely, cranial nerves II through XII intact PSYCH: The patient's mood and manner are appropriate. Grooming and personal hygiene are appropriate.  MEDICAL DECISION MAKING: Patient here with contractions. OB nurse Olegario MessierKathy at bedside. Patient's baby  looks good on the monitor with fetal heart rate in the 140s. She is having contractions every 5-7 minutes. We are giving her IV fluids. Dr. Despina Hidden with OB/GYN has recommended terbutaline. We'll continue to monitor patient.  ED PROGRESS: Patient's contractions have stopped after terbutaline. She reports feeling much better. Have advised her to follow-up with her OB/GYN at [facility] in [place]. I recommended increasing her water intake. Discussed return precautions. She verbalized understanding and is comfortable with this plan.   CRITICAL CARE Performed by: Raelyn Number   Total critical care time: 30 minutes - premature contractions, given IVF and terbutaline, consult with OBGYN  Critical care time was exclusive of separately billable procedures and treating other patients.  Critical care was necessary to treat or prevent imminent or life-threatening deterioration.  Critical care was time spent personally by me on the following activities: development of treatment plan with patient and/or surrogate as well as nursing, discussions with consultants, evaluation of patient's response to treatment, examination of patient, obtaining history from patient or surrogate, ordering and performing treatments and interventions, ordering and review of laboratory studies, ordering and review of radiographic studies, pulse oximetry and re-evaluation of patient's condition.     Layla Maw Ezra Marquess, DO 08/25/15 1610  Layla Maw Briggitte Boline, DO 08/25/15 [phone]

## 2015-08-25 NOTE — Discharge Instructions (Signed)
Braxton Hicks Contractions °Contractions of the uterus can occur throughout pregnancy. Contractions are not always a sign that you are in labor.  °WHAT ARE BRAXTON HICKS CONTRACTIONS?  °Contractions that occur before labor are called Braxton Hicks contractions, or false labor. Toward the end of pregnancy (32-34 weeks), these contractions can develop more often and may become more forceful. This is not true labor because these contractions do not result in opening (dilatation) and thinning of the cervix. They are sometimes difficult to tell apart from true labor because these contractions can be forceful and people have different pain tolerances. You should not feel embarrassed if you go to the hospital with false labor. Sometimes, the only way to tell if you are in true labor is for your health care provider to look for changes in the cervix. °If there are no prenatal problems or other health problems associated with the pregnancy, it is completely safe to be sent home with false labor and await the onset of true labor. °HOW CAN YOU TELL THE DIFFERENCE BETWEEN TRUE AND FALSE LABOR? °False Labor °· The contractions of false labor are usually shorter and not as hard as those of true labor.   °· The contractions are usually irregular.   °· The contractions are often felt in the front of the lower abdomen and in the groin.   °· The contractions may go away when you walk around or change positions while lying down.   °· The contractions get weaker and are shorter lasting as time goes on.   °· The contractions do not usually become progressively stronger, regular, and closer together as with true labor.   °True Labor °· Contractions in true labor last 30-70 seconds, become very regular, usually become more intense, and increase in frequency.   °· The contractions do not go away with walking.   °· The discomfort is usually felt in the top of the uterus and spreads to the lower abdomen and low back.   °· True labor can be  determined by your health care provider with an exam. This will show that the cervix is dilating and getting thinner.   °WHAT TO REMEMBER °· Keep up with your usual exercises and follow other instructions given by your health care provider.   °· Take medicines as directed by your health care provider.   °· Keep your regular prenatal appointments.   °· Eat and drink lightly if you think you are going into labor.   °· If Braxton Hicks contractions are making you uncomfortable:   °¨ Change your position from lying down or resting to walking, or from walking to resting.   °¨ Sit and rest in a tub of warm water.   °¨ Drink 2-3 glasses of water. Dehydration may cause these contractions.   °¨ Do slow and deep breathing several times an hour.   °WHEN SHOULD I SEEK IMMEDIATE MEDICAL CARE? °Seek immediate medical care if: °· Your contractions become stronger, more regular, and closer together.   °· You have fluid leaking or gushing from your vagina.   °· You have a fever.   °· You pass blood-tinged mucus.   °· You have vaginal bleeding.   °· You have continuous abdominal pain.   °· You have low back pain that you never had before.   °· You feel your baby's head pushing down and causing pelvic pressure.   °· Your baby is not moving as much as it used to.   °  °This information is not intended to replace advice given to you by your health care provider. Make sure you discuss any questions you have with your health care   provider. °  °Document Released: 07/28/2005 Document Revised: 08/02/2013 Document Reviewed: 05/09/2013 °Elsevier Interactive Patient Education ©2016 [employer] ° °

## 2015-08-25 NOTE — ED Notes (Signed)
Pt. Reports I am feeling much better.  Rapid Response OB nurse at the bedside

## 2016-03-26 ENCOUNTER — Encounter (HOSPITAL_COMMUNITY): Payer: Self-pay

## 2023-12-21 ENCOUNTER — Other Ambulatory Visit: Payer: Self-pay | Admitting: Hematology and Oncology

## 2023-12-21 DIAGNOSIS — D500 Iron deficiency anemia secondary to blood loss (chronic): Secondary | ICD-10-CM

## 2023-12-21 NOTE — Progress Notes (Deleted)
 Shannon Salazar [address] [place],  [place]  [address] [phone]  Clinic Day:  12/21/2023   Referring physician: Alessandra Salazar, Shannon Low *  Patient Care Team: Patient Care Team: [facility], Shannon Hamel, FNP as PCP - General (Family Medicine)   REASON FOR CONSULTATION:  Iron deficiency anemia  HISTORY OF PRESENT ILLNESS:  [name] is a 33 y.o. female with iron deficiency anemia who is referred in consultation by Shannon Salazar for assessment and management. She was seen on April 28 for routine follow-up. Serum iron was 32, TIBC 490, iron saturation 7% and ferritin 3.  B12 was normal.  Most recent CBC on March 11 revealed a hemoglobin of 11.4 with an MCV of 81 WBCs 4.3 with 51% neutrophils, 37% lymphocytes, 7% monocytes and 5% eosinophils.  Platelets 254,000.  The patient reports ***.  She ***pica to ice.  She ***previous transfusion or IV iron.  She states her menses ***.  She states she is ***up to date on pelvic examination and Pap smear.   Past medical history: Discoid lupus, depression/anxiety, migraine, Meige syndrome (blepharospasm with oromandibular dystonia).  Status post C-section x 3.  Social history: She smokes ***.  She drinks***.  She denies other substance use.  Family history:    REVIEW OF SYSTEMS:  Review of Systems - Oncology   VITALS:   unknown if currently breastfeeding.  Wt Readings from Last 3 Encounters:  08/25/15 221 lb (100.2 kg)  05/22/15 224 lb 3.2 oz (101.7 kg)  08/07/12 217 lb 9.6 oz (98.7 kg)    There is no height or weight on file to calculate BMI.  Performance status (ECOG): {CHL ONC [id number]  PHYSICAL EXAM:  Physical Exam   LABS:      Latest Ref Rng & Units 11/22/2008   12:21 PM 02/19/2008   12:24 AM  CBC  WBC 4.0 - 10.5 K/uL 4.3  6.2   Hemoglobin 12.0 - 15.0 g/dL 41.3  24.4   Hematocrit 36.0 - 46.0 % 38.5  37.7   Platelets 150 - 400 K/uL 173  211       Latest Ref Rng & Units  12/26/2008    6:20 PM 02/19/2008   12:24 AM  CMP  Glucose 70 - 99 mg/dL 88  010   BUN 6 - 23 mg/dL 10  9   Creatinine 0.4 - 1.2 mg/dL 2.72  5.36   Sodium 644 - 145 mEq/L 139  140   Potassium 3.5 - 5.1 mEq/L 4.2  3.8   Chloride 96 - 112 mEq/L 108  109   CO2 19 - 32 mEq/L 23  26   Calcium 8.4 - 10.5 mg/dL 9.5  9.3   Total Protein 6.0 - 8.3 g/dL 6.9    Total Bilirubin 0.3 - 1.2 mg/dL 0.4    Alkaline Phos 39 - 117 U/L 84    AST 0 - 37 U/L 24    ALT 0 - 35 U/L 16       No results found for: "CEA1", "CEA" / No results found for: "CEA1", "CEA" No results found for: "PSA1" No results found for: "IHK742" No results found for: "CAN125"  No results found for: "TOTALPROTELP", "ALBUMINELP", "A1GS", "A2GS", "BETS", "BETA2SER", "GAMS", "MSPIKE", "SPEI" No results found for: "TIBC", "FERRITIN", "IRONPCTSAT" No results found for: "LDH"  STUDIES:  No results found.    HISTORY:   Past Medical History:  Diagnosis Date   Anemia    Asthma  Discoid lupus    Syncope     Past Surgical History:  Procedure Laterality Date   CESAREAN SECTION     twin delivery    No family history on file.  Social History:  reports that she has never smoked. She does not have any smokeless tobacco history on file. She reports that she does not drink alcohol and does not use drugs.The patient is {Blank single:19197::"alone","accompanied by"} *** today.  Allergies: No Known Allergies  Current Medications: Current Outpatient Medications  Medication Sig Dispense Refill   acetaminophen  (TYLENOL ) 325 MG tablet Take 2 tablets (650 mg total) by mouth every 6 (six) hours as needed for pain.     Prenatal Vit-Fe Fumarate-FA (PRENATAL MULTIVITAMIN) TABS Take 1 tablet by mouth daily.     No current facility-administered medications for this visit.     ASSESSMENT & PLAN:   Assessment/Plan:  [name] is a 33 y.o. female with iron deficiency anemia felt to be due to menorrhagia.  I recommended IV iron in the  form of Venofer in the upcoming days.  We discussed the rare but serious side effect of allergic reaction, lowering blood pressure and shortness of breath, as well as more common side effects of headaches, flushing, nausea, myalgias/arthralgias and irritation at the injection site.  After discussing the assessment and treatment plan with the patient, the patient ***to proceed with IV iron.  The patient was provided an opportunity to ask questions and all were answered.  The patient agreed with the plan and demonstrated an understanding of the instructions.    Thank you for the referral    *** minutes was spent in patient care.  This included time spent preparing to see the patient (e.g., review of tests), obtaining and/or reviewing separately obtained history, counseling and educating the patient/family/caregiver, ordering medications, tests, or procedures; documenting clinical information in the electronic or other health record, independently interpreting results and communicating results to the patient/family/caregiver as well as coordination of care.      Alfonso Ike, PA-C   Physician Assistant [facility]  [phone]

## 2023-12-23 ENCOUNTER — Encounter: Payer: Self-pay | Admitting: Hematology and Oncology

## 2023-12-25 ENCOUNTER — Inpatient Hospital Stay: Payer: MEDICAID

## 2023-12-25 ENCOUNTER — Inpatient Hospital Stay: Payer: MEDICAID | Admitting: Hematology and Oncology

## 2023-12-31 ENCOUNTER — Inpatient Hospital Stay: Payer: MEDICAID | Admitting: Hematology and Oncology

## 2023-12-31 ENCOUNTER — Other Ambulatory Visit: Payer: Self-pay | Admitting: Hematology and Oncology

## 2023-12-31 ENCOUNTER — Inpatient Hospital Stay: Payer: MEDICAID

## 2023-12-31 DIAGNOSIS — D500 Iron deficiency anemia secondary to blood loss (chronic): Secondary | ICD-10-CM

## 2023-12-31 NOTE — Progress Notes (Signed)
 [facility] [address] [place],  [place]  [address] [phone]  Clinic Day:  01/08/2024   Referring physician: Alessandra Ancona, Hildegard Low *  Patient Care Team: Patient Care Team: [facility], Elvan Hamel, FNP as PCP - General (Family Medicine)   REASON FOR CONSULTATION:  Iron  deficiency anemia  HISTORY OF PRESENT ILLNESS:  Shannon Salazar is a 33 y.o. female with iron  deficiency anemia who is referred in consultation by Hildegard Low Jesus Alessandra Ancona for assessment and management. She was seen on April 28 for routine follow-up. Serum iron  was 32, TIBC 490, iron  saturation 7% and ferritin 3.  B12 was normal.  Most recent CBC on March 11 revealed a hemoglobin of 11.4 with an MCV of 81 WBCs 4.3 with 51% neutrophils, 37% lymphocytes, 7% monocytes and 5% eosinophils.  Platelets 254,000.  She was scheduled to see me earlier this month but was confused on the appointment date.  The patient reports chronic anemia, taking iron  on and off over years.  Oral iron  previously caused constipation. She has not tried this again. She reports significant fatigue.  She reports pica to ice.  She denies previous transfusion or IV iron .  She reports a headache today.  She states her menses are irregular, bleeding on and off for 7 to 15 days, with 2-3 heavy days.  She has tried OCPs for irregular menses which was not effective.  She plans to see her GYN again.  She denies other overt bleeding.  She states she is up to date on pelvic examination and Pap smear.   Past medical history: Discoid lupus, asthma, depression/anxiety, migraine, Meige syndrome (blepharospasm with oromandibular dystonia).  Status post C-section x 4.  Social history: She smokes 1 pack per day of cigarettes.  She reports occasional alcohol use.  She denies other substance use.  She is single and lives with her 5 children. 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  Family history: She states her sister and father have had anemia.  There  is no family history of cancer.    REVIEW OF SYSTEMS:  Review of Systems  Constitutional:  Positive for diaphoresis and fatigue. Negative for appetite change, chills, fever and unexpected weight change.  HENT:   Negative for lump/mass, mouth sores and sore throat.   Respiratory:  Negative for cough, hemoptysis and shortness of breath.   Cardiovascular:  Negative for chest pain and leg swelling.  Gastrointestinal:  Negative for abdominal pain, blood in stool, constipation, diarrhea, nausea and vomiting.  Endocrine: Positive for hot flashes.  Genitourinary:  Positive for menstrual problem. Negative for difficulty urinating, dysuria, frequency and hematuria.   Musculoskeletal:  Negative for arthralgias, back pain, gait problem and myalgias.  Skin:  Negative for rash.  Neurological:  Positive for dizziness (with hot flash) and headaches (migraine). Negative for extremity weakness, gait problem, light-headedness and numbness.  Hematological:  Negative for adenopathy. Does not bruise/bleed easily.  Psychiatric/Behavioral:  Negative for depression and sleep disturbance. The patient is not nervous/anxious.      VITALS:  Blood pressure 110/72, pulse 100, temperature 98 F (36.7 C), temperature source Oral, resp. rate 18, height 5' 4.7 (1.643 m), weight 238 lb 12.8 oz (108.3 kg), last menstrual period 11/23/2023, SpO2 100%, not currently breastfeeding. Wt Readings from Last 3 Encounters:  01/08/24 238 lb 12.8 oz (108.3 kg)  08/25/15 221 lb (100.2 kg)  05/22/15 224 lb 3.2 oz (101.7 kg)  Body mass index is 40.11 kg/m.  Performance status (  ECOG): 1 - Symptomatic but completely ambulatory  PHYSICAL EXAM:  Physical Exam Vitals and nursing note reviewed.  Constitutional:      General: She is not in acute distress.    Appearance: Normal appearance. She is obese. She is not ill-appearing.  HENT:     Head: Normocephalic and atraumatic.     Mouth/Throat:     Mouth: Mucous membranes are moist.      Pharynx: Oropharynx is clear. No oropharyngeal exudate or posterior oropharyngeal erythema.  Eyes:     General: No scleral icterus.    Extraocular Movements: Extraocular movements intact.     Conjunctiva/sclera: Conjunctivae normal.     Pupils: Pupils are equal, round, and reactive to light.  Cardiovascular:     Rate and Rhythm: Normal rate and regular rhythm.     Heart sounds: Normal heart sounds. No murmur heard.    No friction rub. No gallop.  Pulmonary:     Effort: Pulmonary effort is normal.     Breath sounds: Normal breath sounds. No wheezing, rhonchi or rales.  Abdominal:     General: There is no distension.     Palpations: Abdomen is soft. There is no hepatomegaly, splenomegaly or mass.     Tenderness: There is no abdominal tenderness.  Musculoskeletal:        General: Normal range of motion.     Cervical back: Normal range of motion and neck supple. No tenderness.     Right lower leg: No edema.     Left lower leg: No edema.  Lymphadenopathy:     Cervical: No cervical adenopathy.     Upper Body:     Right upper body: No supraclavicular or axillary adenopathy.     Left upper body: No supraclavicular or axillary adenopathy.     Lower Body: No right inguinal adenopathy. No left inguinal adenopathy.  Skin:    General: Skin is warm and dry.     Coloration: Skin is not jaundiced.     Findings: No rash.  Neurological:     Mental Status: She is alert and oriented to person, place, and time.     Cranial Nerves: No cranial nerve deficit.  Psychiatric:        Mood and Affect: Mood normal.        Behavior: Behavior normal.        Thought Content: Thought content normal.      LABS:      Latest Ref Rng & Units 01/08/2024    1:18 PM 11/22/2008   12:21 PM 02/19/2008   12:24 AM  CBC  WBC 4.0 - 10.5 K/uL 5.5  4.3  6.2   Hemoglobin 12.0 - 15.0 g/dL 40.9  81.1  91.4   Hematocrit 36.0 - 46.0 % 36.9  38.5  37.7   Platelets 150 - 400 K/uL 250  173  211       Latest Ref Rng  & Units 01/08/2024    1:18 PM 12/26/2008    6:20 PM 02/19/2008   12:24 AM  CMP  Glucose 70 - 99 mg/dL 782  88  956   BUN 6 - 20 mg/dL 11  10  9    Creatinine 0.44 - 1.00 mg/dL 2.13  0.86  5.78   Sodium 135 - 145 mmol/L 137  139  140   Potassium 3.5 - 5.1 mmol/L 3.9  4.2  3.8   Chloride 98 - 111 mmol/L 103  108  109   CO2 22 - 32 mmol/L  22  23  26    Calcium 8.9 - 10.3 mg/dL 9.6  9.5  9.3   Total Protein 6.5 - 8.1 g/dL 7.8  6.9    Total Bilirubin 0.0 - 1.2 mg/dL 0.3  0.4    Alkaline Phos 38 - 126 U/L 107  84    AST 15 - 41 U/L 24  24    ALT 0 - 44 U/L 16  16      No results found for: TIBC, FERRITIN, IRONPCTSAT   STUDIES:  No results found.    HISTORY:   Past Medical History:  Diagnosis Date   Anemia    Asthma    Depression    Discoid lupus    GAD (generalized anxiety disorder)    Meige syndrome (blepharospasm with oromandibular dystonia)    Menorrhagia    Microcytic hypochromic anemia    Migraine    Syncope     Past Surgical History:  Procedure Laterality Date   CESAREAN SECTION     4 c sections / with one twin delivery   TUBAL LIGATION      History reviewed. No pertinent family history.  Social History:  reports that she has been smoking cigarettes. She started smoking about 7 years ago. She has a 7.4 pack-year smoking history. She has never used smokeless tobacco. She reports current alcohol use of about 3.0 standard drinks of alcohol per week. She reports that she does not use drugs.The patient is accompanied by her cousin today.  Allergies: No Known Allergies  Current Medications: Current Outpatient Medications  Medication Sig Dispense Refill   Iron -Folic Acid -C-B6-B12-Zinc  150-1.25 MG TABS Take 1 each by mouth daily. 30 tablet 3   acetaminophen  (TYLENOL ) 325 MG tablet Take 2 tablets (650 mg total) by mouth every 6 (six) hours as needed for pain.     predniSONE (DELTASONE) 5 MG tablet Take 5 mg by mouth as needed.     No current  facility-administered medications for this visit.     Assessment/Plan:  Shannon Salazar is a 33 y.o. female with iron  deficiency anemia felt to be due to irregular menses/menorrhagia.  Repeat iron  studies, B12 and folate are pending from today.  I recommended IV iron  in the form of Venofer in the upcoming days.  We discussed the rare but serious side effect of allergic reaction, lowering blood pressure and shortness of breath, as well as more common side effects of headaches, flushing, nausea, myalgias/arthralgias and irritation at the injection site.  After discussing the assessment and treatment plan with the patient, the patient declined IV iron .  She states she would like to go back on oral iron  daily.  I advised her this causes constipation, she could take senna-S.  I will plan to see her back in 3 months with a CBC, iron /TIBC and ferritin.  The patient was provided an opportunity to ask questions and all were answered.  Thank you for the referral.    30 minutes was spent in patient care.  This included time spent preparing to see the patient (e.g., review of tests), obtaining and/or reviewing separately obtained history, counseling and educating the patient/family/caregiver, ordering medications, tests, or procedures; documenting clinical information in the electronic or other health record, independently interpreting results and communicating results to the patient/family/caregiver as well as coordination of care.      Alfonso Ike, PA-C   Physician Assistant [facility] [place] [phone]

## 2024-01-08 ENCOUNTER — Inpatient Hospital Stay: Payer: MEDICAID | Attending: Hematology and Oncology

## 2024-01-08 ENCOUNTER — Encounter: Payer: Self-pay | Admitting: Hematology and Oncology

## 2024-01-08 ENCOUNTER — Encounter: Payer: Self-pay | Admitting: Dietician

## 2024-01-08 ENCOUNTER — Inpatient Hospital Stay: Payer: MEDICAID | Admitting: Hematology and Oncology

## 2024-01-08 VITALS — BP 110/72 | HR 100 | Temp 98.0°F | Resp 18 | Ht 64.7 in | Wt 238.8 lb

## 2024-01-08 DIAGNOSIS — F17210 Nicotine dependence, cigarettes, uncomplicated: Secondary | ICD-10-CM

## 2024-01-08 DIAGNOSIS — Z79899 Other long term (current) drug therapy: Secondary | ICD-10-CM | POA: Diagnosis not present

## 2024-01-08 DIAGNOSIS — D509 Iron deficiency anemia, unspecified: Secondary | ICD-10-CM

## 2024-01-08 DIAGNOSIS — D500 Iron deficiency anemia secondary to blood loss (chronic): Secondary | ICD-10-CM

## 2024-01-08 DIAGNOSIS — D508 Other iron deficiency anemias: Secondary | ICD-10-CM

## 2024-01-08 DIAGNOSIS — N926 Irregular menstruation, unspecified: Secondary | ICD-10-CM | POA: Diagnosis present

## 2024-01-08 LAB — CBC WITH DIFFERENTIAL (CANCER CENTER ONLY)
Abs Immature Granulocytes: 0.01 10*3/uL (ref 0.00–0.07)
Basophils Absolute: 0 10*3/uL (ref 0.0–0.1)
Basophils Relative: 0 %
Eosinophils Absolute: 0.2 10*3/uL (ref 0.0–0.5)
Eosinophils Relative: 4 %
HCT: 36.9 % (ref 36.0–46.0)
Hemoglobin: 11.7 g/dL — ABNORMAL LOW (ref 12.0–15.0)
Immature Granulocytes: 0 %
Lymphocytes Relative: 31 %
Lymphs Abs: 1.7 10*3/uL (ref 0.7–4.0)
MCH: 25.2 pg — ABNORMAL LOW (ref 26.0–34.0)
MCHC: 31.7 g/dL (ref 30.0–36.0)
MCV: 79.5 fL — ABNORMAL LOW (ref 80.0–100.0)
Monocytes Absolute: 0.3 10*3/uL (ref 0.1–1.0)
Monocytes Relative: 5 %
Neutro Abs: 3.3 10*3/uL (ref 1.7–7.7)
Neutrophils Relative %: 60 %
Platelet Count: 250 10*3/uL (ref 150–400)
RBC: 4.64 MIL/uL (ref 3.87–5.11)
RDW: 17.2 % — ABNORMAL HIGH (ref 11.5–15.5)
WBC Count: 5.5 10*3/uL (ref 4.0–10.5)
nRBC: 0 % (ref 0.0–0.2)

## 2024-01-08 LAB — IRON AND TIBC
Iron: 38 ug/dL (ref 28–170)
Saturation Ratios: 8 % — ABNORMAL LOW (ref 10.4–31.8)
TIBC: 504 ug/dL — ABNORMAL HIGH (ref 250–450)
UIBC: 466 ug/dL

## 2024-01-08 LAB — CMP (CANCER CENTER ONLY)
ALT: 16 U/L (ref 0–44)
AST: 24 U/L (ref 15–41)
Albumin: 4 g/dL (ref 3.5–5.0)
Alkaline Phosphatase: 107 U/L (ref 38–126)
Anion gap: 12 (ref 5–15)
BUN: 11 mg/dL (ref 6–20)
CO2: 22 mmol/L (ref 22–32)
Calcium: 9.6 mg/dL (ref 8.9–10.3)
Chloride: 103 mmol/L (ref 98–111)
Creatinine: 1.09 mg/dL — ABNORMAL HIGH (ref 0.44–1.00)
GFR, Estimated: >60 mL/min (ref >60)
Glucose, Bld: 117 mg/dL — ABNORMAL HIGH (ref 70–99)
Potassium: 3.9 mmol/L (ref 3.5–5.1)
Sodium: 137 mmol/L (ref 135–145)
Total Bilirubin: 0.3 mg/dL (ref 0.0–1.2)
Total Protein: 7.8 g/dL (ref 6.5–8.1)

## 2024-01-08 LAB — TECHNOLOGIST SMEAR REVIEW

## 2024-01-08 LAB — FERRITIN: Ferritin: 7 ng/mL — ABNORMAL LOW (ref 11–307)

## 2024-01-08 LAB — VITAMIN B12: Vitamin B-12: 550 pg/mL (ref 180–914)

## 2024-01-08 LAB — FOLATE: Folate: 16.2 ng/mL (ref >5.9)

## 2024-01-08 MED ORDER — IRON-FOLIC ACID-C-B6-B12-ZINC 150-1.25 MG PO TABS: 1.0000 | ORAL_TABLET | Freq: Every day | ORAL | 3 refills | Status: AC

## 2024-02-18 ENCOUNTER — Ambulatory Visit: Payer: MEDICAID | Admitting: Internal Medicine
